# Patient Record
Sex: Male | Born: 1955 | Race: White | Marital: Married | Smoking: Current every day smoker
Health system: Southern US, Community
[De-identification: ages and names within clinical notes are randomized; demographics above are authoritative.]

---

## 2007-10-04 DIAGNOSIS — I10 Essential (primary) hypertension: Secondary | ICD-10-CM

## 2007-10-05 DIAGNOSIS — I2510 Atherosclerotic heart disease of native coronary artery without angina pectoris: Secondary | ICD-10-CM

## 2011-06-23 DIAGNOSIS — Z720 Tobacco use: Secondary | ICD-10-CM | POA: Insufficient documentation

## 2012-11-01 DIAGNOSIS — E119 Type 2 diabetes mellitus without complications: Secondary | ICD-10-CM | POA: Insufficient documentation

## 2014-04-03 DIAGNOSIS — E349 Endocrine disorder, unspecified: Secondary | ICD-10-CM | POA: Insufficient documentation

## 2014-06-04 DIAGNOSIS — G8929 Other chronic pain: Secondary | ICD-10-CM | POA: Insufficient documentation

## 2014-06-04 DIAGNOSIS — M549 Dorsalgia, unspecified: Secondary | ICD-10-CM

## 2015-04-08 ENCOUNTER — Other Ambulatory Visit: Payer: Self-pay | Admitting: Physical Medicine and Rehabilitation

## 2015-04-08 DIAGNOSIS — G8929 Other chronic pain: Secondary | ICD-10-CM

## 2015-04-08 DIAGNOSIS — M545 Low back pain, unspecified: Secondary | ICD-10-CM

## 2015-04-11 ENCOUNTER — Other Ambulatory Visit: Payer: Self-pay | Admitting: Physical Medicine and Rehabilitation

## 2015-04-11 ENCOUNTER — Ambulatory Visit
Admission: RE | Admit: 2015-04-11 | Discharge: 2015-04-11 | Disposition: A | Discharge status: Home / Self Care | Payer: BLUE CROSS/BLUE SHIELD | Source: Ambulatory Visit | Attending: Physical Medicine and Rehabilitation | Admitting: Physical Medicine and Rehabilitation

## 2015-04-11 DIAGNOSIS — G8929 Other chronic pain: Secondary | ICD-10-CM

## 2015-04-11 DIAGNOSIS — M545 Low back pain, unspecified: Secondary | ICD-10-CM

## 2015-04-11 MED ORDER — IOHEXOL 180 MG/ML  SOLN
1.0000 mL | Freq: Once | INTRAMUSCULAR | Status: AC | PRN
  Administered 2015-04-11: 1 mL via EPIDURAL

## 2015-04-11 MED ORDER — METHYLPREDNISOLONE ACETATE 40 MG/ML INJ SUSP (RADIOLOG
120.0000 mg | Freq: Once | INTRAMUSCULAR | Status: AC
  Administered 2015-04-11: 120 mg via EPIDURAL

## 2015-04-11 NOTE — Discharge Instructions (Signed)

## 2015-10-29 ENCOUNTER — Other Ambulatory Visit: Payer: Self-pay | Admitting: Physical Medicine and Rehabilitation

## 2015-10-29 DIAGNOSIS — G8929 Other chronic pain: Secondary | ICD-10-CM

## 2015-10-29 DIAGNOSIS — M545 Low back pain, unspecified: Secondary | ICD-10-CM

## 2015-11-05 ENCOUNTER — Other Ambulatory Visit: Payer: Self-pay

## 2015-11-05 ENCOUNTER — Ambulatory Visit
Admission: RE | Admit: 2015-11-05 | Discharge: 2015-11-05 | Disposition: A | Discharge status: Home / Self Care | Payer: BLUE CROSS/BLUE SHIELD | Source: Ambulatory Visit | Attending: Physical Medicine and Rehabilitation | Admitting: Physical Medicine and Rehabilitation

## 2015-11-05 DIAGNOSIS — M545 Low back pain: Principal | ICD-10-CM

## 2015-11-05 DIAGNOSIS — G8929 Other chronic pain: Secondary | ICD-10-CM

## 2015-11-05 MED ORDER — IOHEXOL 180 MG/ML  SOLN: 1.0000 mL | Freq: Once | INTRAMUSCULAR | Status: DC | PRN

## 2015-11-05 MED ORDER — METHYLPREDNISOLONE ACETATE 40 MG/ML INJ SUSP (RADIOLOG: 120.0000 mg | Freq: Once | INTRAMUSCULAR | Status: DC

## 2015-11-05 NOTE — Discharge Instructions (Signed)

## 2015-11-07 ENCOUNTER — Other Ambulatory Visit: Payer: BLUE CROSS/BLUE SHIELD

## 2016-02-10 ENCOUNTER — Other Ambulatory Visit: Payer: Self-pay | Admitting: Physical Medicine and Rehabilitation

## 2016-02-10 DIAGNOSIS — G8929 Other chronic pain: Secondary | ICD-10-CM

## 2016-02-10 DIAGNOSIS — M545 Low back pain: Principal | ICD-10-CM

## 2016-02-17 ENCOUNTER — Ambulatory Visit
Admission: RE | Admit: 2016-02-17 | Discharge: 2016-02-17 | Disposition: A | Discharge status: Home / Self Care | Payer: BLUE CROSS/BLUE SHIELD | Source: Ambulatory Visit | Attending: Physical Medicine and Rehabilitation | Admitting: Physical Medicine and Rehabilitation

## 2016-02-17 DIAGNOSIS — G8929 Other chronic pain: Secondary | ICD-10-CM

## 2016-02-17 DIAGNOSIS — M545 Low back pain: Principal | ICD-10-CM

## 2016-02-17 MED ORDER — IOPAMIDOL (ISOVUE-M 200) INJECTION 41%
1.0000 mL | Freq: Once | INTRAMUSCULAR | Status: AC
  Administered 2016-02-17: 1 mL via EPIDURAL

## 2016-02-17 MED ORDER — METHYLPREDNISOLONE ACETATE 40 MG/ML INJ SUSP (RADIOLOG
120.0000 mg | Freq: Once | INTRAMUSCULAR | Status: AC
  Administered 2016-02-17: 120 mg via EPIDURAL

## 2016-02-17 NOTE — Discharge Instructions (Signed)

## 2016-05-06 ENCOUNTER — Other Ambulatory Visit: Payer: Self-pay | Admitting: Physical Medicine and Rehabilitation

## 2016-05-06 DIAGNOSIS — M545 Low back pain, unspecified: Secondary | ICD-10-CM

## 2016-05-06 DIAGNOSIS — G8929 Other chronic pain: Secondary | ICD-10-CM

## 2016-05-06 DIAGNOSIS — M5416 Radiculopathy, lumbar region: Secondary | ICD-10-CM

## 2016-05-11 ENCOUNTER — Ambulatory Visit
Admission: RE | Admit: 2016-05-11 | Discharge: 2016-05-11 | Disposition: A | Discharge status: Home / Self Care | Payer: BLUE CROSS/BLUE SHIELD | Source: Ambulatory Visit | Attending: Physical Medicine and Rehabilitation | Admitting: Physical Medicine and Rehabilitation

## 2016-05-11 DIAGNOSIS — G8929 Other chronic pain: Secondary | ICD-10-CM

## 2016-05-11 DIAGNOSIS — M5416 Radiculopathy, lumbar region: Secondary | ICD-10-CM

## 2016-05-11 DIAGNOSIS — M545 Low back pain: Principal | ICD-10-CM

## 2016-05-11 MED ORDER — METHYLPREDNISOLONE ACETATE 40 MG/ML INJ SUSP (RADIOLOG
120.0000 mg | Freq: Once | INTRAMUSCULAR | Status: AC
  Administered 2016-05-11: 120 mg via EPIDURAL

## 2016-05-11 MED ORDER — IOPAMIDOL (ISOVUE-M 200) INJECTION 41%
1.0000 mL | Freq: Once | INTRAMUSCULAR | Status: AC
  Administered 2016-05-11: 1 mL via EPIDURAL

## 2016-05-11 NOTE — Discharge Instructions (Signed)

## 2016-07-29 ENCOUNTER — Other Ambulatory Visit: Payer: Self-pay | Admitting: Physical Medicine and Rehabilitation

## 2016-07-29 DIAGNOSIS — M545 Low back pain: Secondary | ICD-10-CM

## 2016-08-13 ENCOUNTER — Other Ambulatory Visit: Payer: Self-pay | Admitting: Physical Medicine and Rehabilitation

## 2016-08-13 ENCOUNTER — Ambulatory Visit
Admission: RE | Admit: 2016-08-13 | Discharge: 2016-08-13 | Disposition: A | Discharge status: Home / Self Care | Payer: BLUE CROSS/BLUE SHIELD | Source: Ambulatory Visit | Attending: Physical Medicine and Rehabilitation | Admitting: Physical Medicine and Rehabilitation

## 2016-08-13 DIAGNOSIS — M545 Low back pain: Secondary | ICD-10-CM

## 2016-08-13 MED ORDER — IOPAMIDOL (ISOVUE-M 200) INJECTION 41%
1.0000 mL | Freq: Once | INTRAMUSCULAR | Status: AC
  Administered 2016-08-13: 1 mL via EPIDURAL

## 2016-08-13 MED ORDER — METHYLPREDNISOLONE ACETATE 40 MG/ML INJ SUSP (RADIOLOG
120.0000 mg | Freq: Once | INTRAMUSCULAR | Status: AC
  Administered 2016-08-13: 120 mg via EPIDURAL

## 2016-08-13 NOTE — Discharge Instructions (Signed)

## 2017-01-13 ENCOUNTER — Other Ambulatory Visit: Payer: Self-pay | Admitting: Physical Medicine and Rehabilitation

## 2017-01-13 DIAGNOSIS — M545 Low back pain: Principal | ICD-10-CM

## 2017-01-13 DIAGNOSIS — G8929 Other chronic pain: Secondary | ICD-10-CM

## 2017-01-18 ENCOUNTER — Ambulatory Visit
Admission: RE | Admit: 2017-01-18 | Discharge: 2017-01-18 | Disposition: A | Discharge status: Home / Self Care | Payer: BLUE CROSS/BLUE SHIELD | Source: Ambulatory Visit | Attending: Physical Medicine and Rehabilitation | Admitting: Physical Medicine and Rehabilitation

## 2017-01-18 DIAGNOSIS — M545 Low back pain: Principal | ICD-10-CM

## 2017-01-18 DIAGNOSIS — G8929 Other chronic pain: Secondary | ICD-10-CM

## 2017-01-18 MED ORDER — METHYLPREDNISOLONE ACETATE 40 MG/ML INJ SUSP (RADIOLOG
120.0000 mg | Freq: Once | INTRAMUSCULAR | Status: AC
  Administered 2017-01-18: 120 mg via EPIDURAL

## 2017-01-18 MED ORDER — IOPAMIDOL (ISOVUE-M 200) INJECTION 41%
1.0000 mL | Freq: Once | INTRAMUSCULAR | Status: AC
  Administered 2017-01-18: 1 mL via EPIDURAL

## 2017-01-18 NOTE — Discharge Instructions (Signed)

## 2017-05-31 ENCOUNTER — Other Ambulatory Visit: Payer: Self-pay | Admitting: Physical Medicine and Rehabilitation

## 2017-05-31 DIAGNOSIS — G8929 Other chronic pain: Secondary | ICD-10-CM

## 2017-05-31 DIAGNOSIS — M545 Low back pain: Principal | ICD-10-CM

## 2017-07-06 ENCOUNTER — Other Ambulatory Visit: Payer: Self-pay | Admitting: General Surgery

## 2017-07-07 ENCOUNTER — Other Ambulatory Visit: Payer: Self-pay | Admitting: Physical Medicine and Rehabilitation

## 2017-07-07 ENCOUNTER — Encounter (HOSPITAL_COMMUNITY): Payer: Self-pay | Admitting: Interventional Radiology

## 2017-07-07 ENCOUNTER — Ambulatory Visit (HOSPITAL_COMMUNITY)
Admission: RE | Admit: 2017-07-07 | Discharge: 2017-07-07 | Disposition: A | Discharge status: Home / Self Care | Payer: BLUE CROSS/BLUE SHIELD | Source: Ambulatory Visit | Attending: Physical Medicine and Rehabilitation | Admitting: Physical Medicine and Rehabilitation

## 2017-07-07 DIAGNOSIS — M4800 Spinal stenosis, site unspecified: Secondary | ICD-10-CM | POA: Insufficient documentation

## 2017-07-07 DIAGNOSIS — G8929 Other chronic pain: Secondary | ICD-10-CM

## 2017-07-07 DIAGNOSIS — M47817 Spondylosis without myelopathy or radiculopathy, lumbosacral region: Secondary | ICD-10-CM | POA: Diagnosis present

## 2017-07-07 DIAGNOSIS — M545 Low back pain: Secondary | ICD-10-CM

## 2017-07-07 DIAGNOSIS — M4727 Other spondylosis with radiculopathy, lumbosacral region: Secondary | ICD-10-CM | POA: Diagnosis not present

## 2017-07-07 HISTORY — PX: IR INJECT/THERA/INC NEEDLE/CATH/PLC EPI/LUMB/SAC W/IMG: IMG6130

## 2017-07-07 MED ORDER — IOPAMIDOL (ISOVUE-M 200) INJECTION 41%
INTRAMUSCULAR | Status: AC
  Filled 2017-07-07: qty 10

## 2017-07-07 MED ORDER — LIDOCAINE HCL (PF) 2 % IJ SOLN
INTRAMUSCULAR | Status: AC
  Filled 2017-07-07: qty 10

## 2017-07-07 MED ORDER — METHYLPREDNISOLONE ACETATE 80 MG/ML IJ SUSP
INTRAMUSCULAR | Status: AC
  Filled 2017-07-07: qty 1

## 2017-07-07 MED ORDER — LIDOCAINE HCL 1 % IJ SOLN
INTRAMUSCULAR | Status: DC | PRN
  Administered 2017-07-07: 5 mL

## 2017-07-07 MED ORDER — SODIUM CHLORIDE 0.9 % IJ SOLN
INTRAMUSCULAR | Status: AC
  Filled 2017-07-07: qty 10

## 2017-07-07 MED ORDER — METHYLPREDNISOLONE ACETATE 40 MG/ML IJ SUSP
INTRAMUSCULAR | Status: AC
  Filled 2017-07-07: qty 1

## 2019-02-24 IMAGING — XA Imaging study
1 series · 4 of 4 positions shown · non-contrast
Comparison: none

CLINICAL DATA: Lumbosacral spondylosis without myelopathy. Back
pain. Bilateral lower extremity radiculopathy. Spinal stenosis.

[Series 300: spine · 4 of 4 slices shown]
[im 1/4]
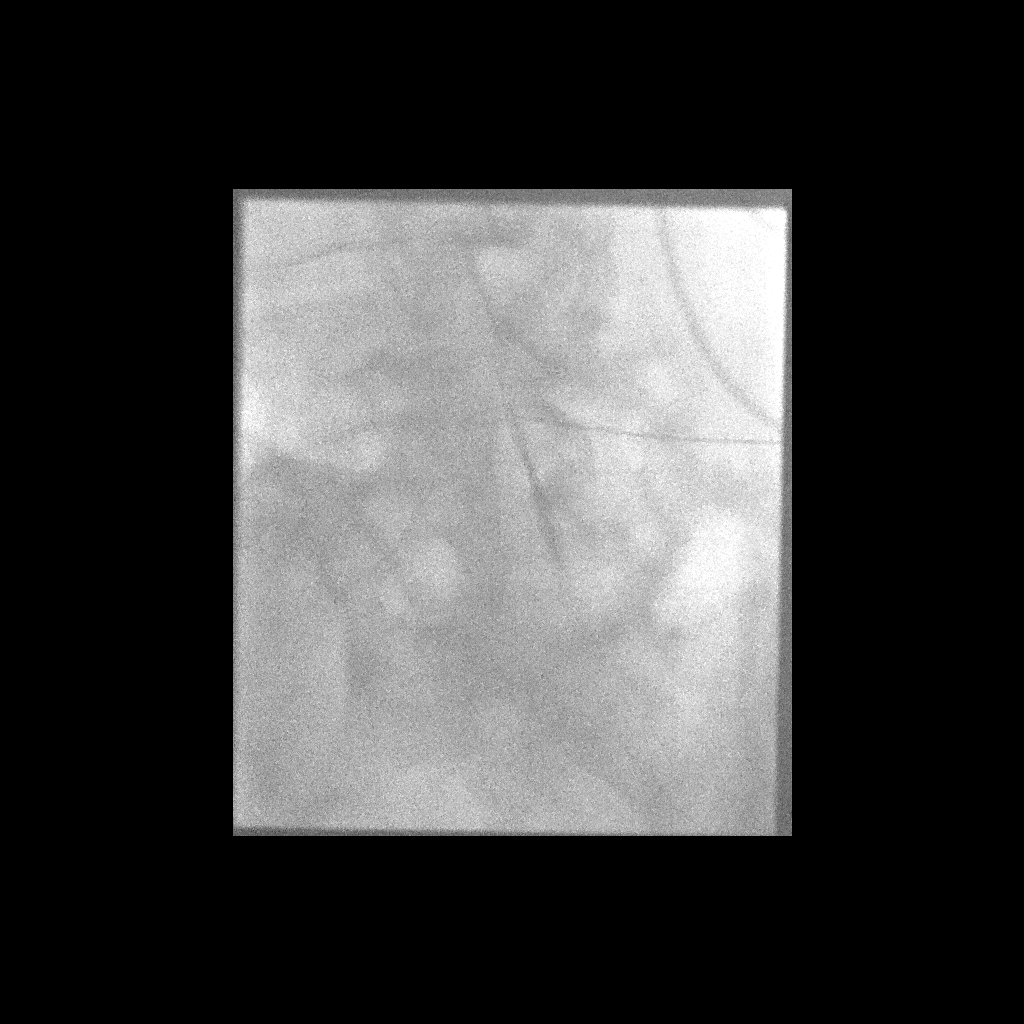
[im 2/4]
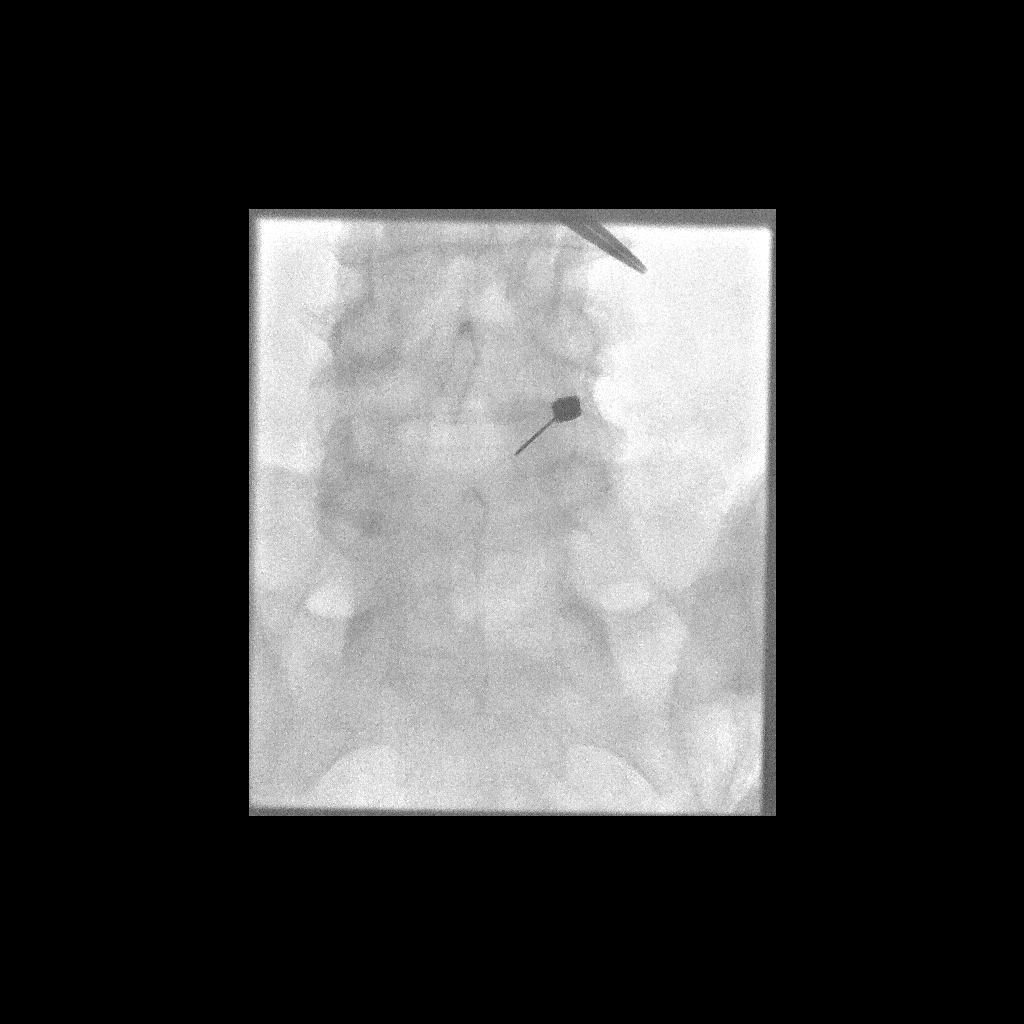
[im 3/4]
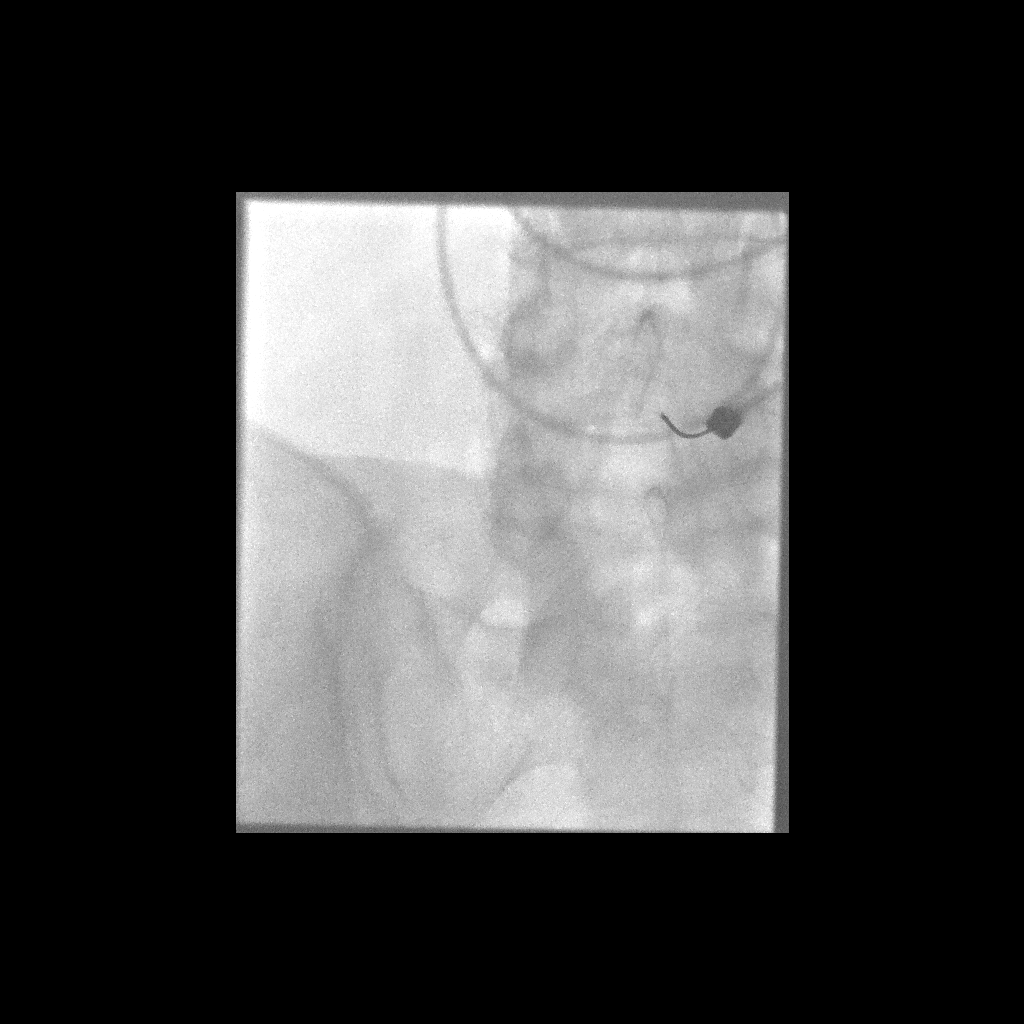
[im 4/4]
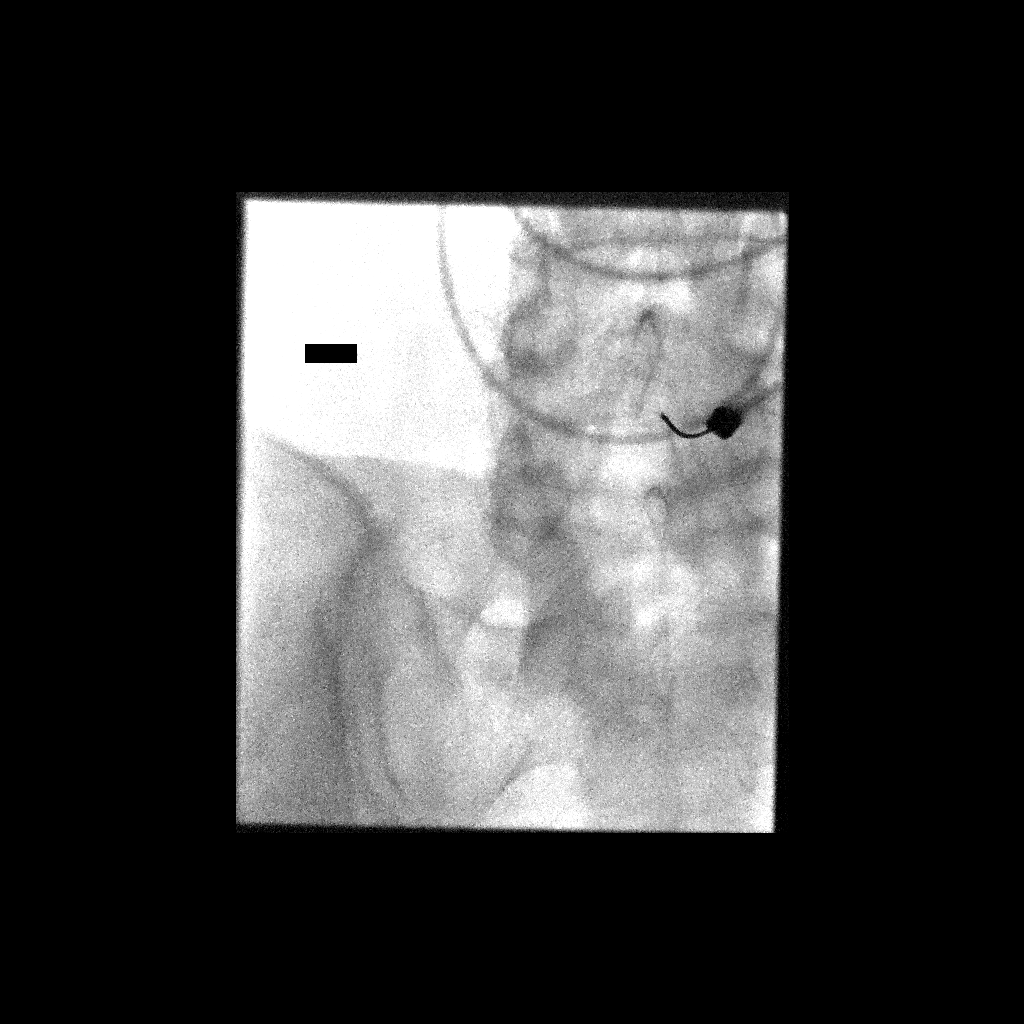

[4 of 4 positions shown; findings below may reference images not displayed]

FLUOROSCOPY TIME:  24 seconds.

PROCEDURE:
The procedure, risks, benefits, and alternatives were explained to
the patient. Questions regarding the procedure were encouraged and
answered. The patient understands and consents to the procedure.

LUMBAR EPIDURAL INJECTION:

An interlaminar approach was performed on right at L5-S1. The
overlying skin was cleansed and anesthetized. A 20 gauge epidural
needle was advanced using loss-of-resistance technique.

DIAGNOSTIC EPIDURAL INJECTION:

Injection of Isovue-M 200 shows a good epidural pattern with spread
above and below the level of needle placement, primarily on the
right no vascular opacification is seen. There is spread of contrast
at L5-S1, L4-5, and at the sacral location. This indicates adequate
deposition of medication above and below the level.

THERAPEUTIC EPIDURAL INJECTION:

One hundred twenty Mg of Depo-Medrol mixed with 4 cc 1% lidocaine
were instilled. The procedure was well-tolerated, and the patient
was discharged thirty minutes following the injection in good
condition.

COMPLICATIONS:
None
IMPRESSION: Technically successful epidural injection on the right L5-S1.
